# Patient Record
Sex: Female | Born: 1970 | Race: White | Hispanic: No | Smoking: Former smoker
Health system: Southern US, Community
[De-identification: ages and names within clinical notes are randomized; demographics above are authoritative.]

## PROBLEM LIST (undated history)

## (undated) DIAGNOSIS — Z8669 Personal history of other diseases of the nervous system and sense organs: Secondary | ICD-10-CM

## (undated) DIAGNOSIS — E282 Polycystic ovarian syndrome: Secondary | ICD-10-CM

## (undated) DIAGNOSIS — E079 Disorder of thyroid, unspecified: Secondary | ICD-10-CM

## (undated) HISTORY — PX: ABDOMINAL HYSTERECTOMY: SHX81

## (undated) HISTORY — DX: Personal history of other diseases of the nervous system and sense organs: Z86.69

## (undated) HISTORY — DX: Disorder of thyroid, unspecified: E07.9

## (undated) HISTORY — PX: TONSILLECTOMY: SUR1361

## (undated) HISTORY — DX: Polycystic ovarian syndrome: E28.2

---

## 2014-09-09 ENCOUNTER — Encounter: Payer: Self-pay | Admitting: Gastroenterology

## 2014-09-13 ENCOUNTER — Telehealth: Payer: Self-pay

## 2014-09-13 NOTE — Telephone Encounter (Signed)
Ref by Meriel Picarish Bellinger, NP for Thyroid Nodule, Hypothyroidism    Review Media Tab    [phone]

## 2014-09-14 NOTE — Telephone Encounter (Signed)
N/a in a reg clinic

## 2014-10-29 NOTE — Telephone Encounter (Signed)
Called referring doctor's office and notified them that the patient has been scheduled.

## 2014-10-29 NOTE — Telephone Encounter (Signed)
Left detailed message- pt scheduled for 11/19/14 @ 1:[facility]pm [facility]. Will mail letter to patients home.

## 2014-11-11 ENCOUNTER — Telehealth: Payer: Self-pay

## 2014-11-11 NOTE — Telephone Encounter (Signed)
See closed note 4/18. Patient calling stating she will not be able to take 6/24 appointment as she will be on vacation. Patient states she will be back after July 4. Patient can be reached at [phone] to schedule for after 7/4.

## 2014-11-12 NOTE — Telephone Encounter (Signed)
Appointment cancelled

## 2014-11-19 ENCOUNTER — Ambulatory Visit: Payer: Self-pay

## 2019-02-27 ENCOUNTER — Encounter: Payer: Self-pay | Admitting: Nurse Practitioner

## 2019-02-27 ENCOUNTER — Other Ambulatory Visit: Payer: Self-pay

## 2019-02-27 ENCOUNTER — Ambulatory Visit: Payer: Self-pay | Admitting: Nurse Practitioner

## 2019-02-27 VITALS — BP 106/75 | HR 71 | Temp 98.0°F | Resp 16 | Ht 63.0 in | Wt 165.0 lb

## 2019-02-27 DIAGNOSIS — L729 Follicular cyst of the skin and subcutaneous tissue, unspecified: Secondary | ICD-10-CM

## 2019-02-27 DIAGNOSIS — L089 Local infection of the skin and subcutaneous tissue, unspecified: Secondary | ICD-10-CM

## 2019-02-27 NOTE — Progress Notes (Signed)
   Subjective:    Patient ID: Deborah Collier, female    DOB: 1970-06-06, 48 y.o.   MRN: [id number]  HPI    Review of Systems     Objective:   Physical Exam        Assessment & Plan:

## 2019-02-27 NOTE — Progress Notes (Signed)
   Subjective:    Patient ID: Deborah Collier, female    DOB: 1971-04-04, 48 y.o.   MRN: [id number]  HPI Anaira is here today for complaints of right index finger lesion x 1 week. She reports this lesion came up and she opened the area up which nothing came from it. Since that time it's been painful, tender, swelling with redness. She denies any self treatment. COVID ROS negative  Review of Systems  Skin: Positive for color change and wound.       Right finger lesion       Objective:   Physical Exam Musculoskeletal:     Comments: FROM to all digits on right hand  Skin:    General: Skin is warm.     Comments: Right index finger at DIP right below nail bed is a hardened circular lesion that is tender, erythematous and slightly erythema around the lesion.  Neurological:     General: No focal deficit present.     Mental Status: He is oriented to person, place, and time.  Psychiatric:        Mood and Affect: Mood normal.           Assessment & Plan:

## 2019-02-27 NOTE — Patient Instructions (Signed)
Soak finger in warm epsom salt and apply samples of triple antibiotic ointment given in office until resolved. Monitor for worsening or signs of infections as discussed For adequate healing it's best not to manipulate or pick at the skin Allow at least 1 week for resolution of infection to decide if you want to see dermatology for the cyst, it may go away on its own Encouraged patient to call the office for an appointment if no improvement in symptoms or if symptoms change or worsen after 72 hours of planned treatment. Patient verbalized understanding of all instructions given/reviewed and has no further questions or concerns at this time.

## 2019-09-18 ENCOUNTER — Encounter: Payer: Self-pay | Admitting: Registered Nurse

## 2019-09-18 ENCOUNTER — Telehealth: Payer: Self-pay | Admitting: Registered Nurse

## 2019-09-18 ENCOUNTER — Telehealth: Payer: Self-pay | Admitting: Nurse Practitioner

## 2019-09-18 ENCOUNTER — Encounter: Payer: Self-pay | Admitting: Nurse Practitioner

## 2019-09-18 ENCOUNTER — Other Ambulatory Visit: Payer: Self-pay

## 2019-09-18 DIAGNOSIS — M542 Cervicalgia: Secondary | ICD-10-CM

## 2019-09-18 DIAGNOSIS — R519 Headache, unspecified: Secondary | ICD-10-CM

## 2019-09-18 MED ORDER — ACETAMINOPHEN 500 MG PO TABS: 1000.0000 mg | ORAL_TABLET | Freq: Four times a day (QID) | ORAL | 0 refills | Status: AC | PRN

## 2019-09-18 MED ORDER — BIOFREEZE 4 % EX GEL: 1.0000 "application " | Freq: Four times a day (QID) | CUTANEOUS | Status: AC | PRN

## 2019-09-18 NOTE — Patient Instructions (Addendum)
Muscle Cramps and Spasms Muscle cramps and spasms occur when a muscle or muscles tighten and you have no control over this tightening (involuntary muscle contraction). They are a common problem and can develop in any muscle. The most common place is in the calf muscles of the leg. Muscle cramps and muscle spasms are both involuntary muscle contractions, but there are some differences between the two:  Muscle cramps are painful. They come and go and may last for a few seconds or up to 15 minutes. Muscle cramps are often more forceful and last longer than muscle spasms.  Muscle spasms may or may not be painful. They may also last just a few seconds or much longer. Certain medical conditions, such as diabetes or Parkinson's disease, can make it more likely to develop cramps or spasms. However, cramps or spasms are usually not caused by a serious underlying problem. Common causes include:  Doing more physical work or exercise than your body is ready for (overexertion).  Overuse from repeating certain movements too many times.  Remaining in a certain position for a long period of time.  Improper preparation, form, or technique while playing a sport or doing an activity.  Dehydration.  Injury.  Side effects of some medicines.  Abnormally low levels of the salts and minerals in your blood (electrolytes), especially potassium and calcium. This could happen if you are taking water pills (diuretics) or if you are pregnant. In many cases, the cause of muscle cramps or spasms is not known. Follow these instructions at home: Managing pain and stiffness      Try massaging, stretching, and relaxing the affected muscle. Do this for several minutes at a time.  If directed, apply heat to tight or tense muscles as often as told by your health care provider. Use the heat source that your health care provider recommends, such as a moist heat pack or a heating pad. ? Place a towel between your skin and  the heat source. ? Leave the heat on for 20-30 minutes. ? Remove the heat if your skin turns bright red. This is especially important if you are unable to feel pain, heat, or cold. You may have a greater risk of getting burned.  If directed, put ice on the affected area. This may help if you are sore or have pain after a cramp or spasm. ? Put ice in a plastic bag. ? Place a towel between your skin and the bag. ? Leavethe ice on for 20 minutes, 2-3 times a day.  Try taking hot showers or baths to help relax tight muscles. Eating and drinking  Drink enough fluid to keep your urine pale yellow. Staying well hydrated may help prevent cramps or spasms.  Eat a healthy diet that includes plenty of nutrients to help your muscles function. A healthy diet includes fruits and vegetables, lean protein, whole grains, and low-fat or nonfat dairy products. General instructions  If you are having frequent cramps, avoid intense exercise for several days.  Take over-the-counter and prescription medicines only as told by your health care provider.  Pay attention to any changes in your symptoms.  Keep all follow-up visits as told by your health care provider. This is important. Contact a health care provider if:  Your cramps or spasms get more severe or happen more often.  Your cramps or spasms do not improve over time. Summary  Muscle cramps and spasms occur when a muscle or muscles tighten and you have no control over this   tightening (involuntary muscle contraction).  The most common place for cramps or spasms to occur is in the calf muscles of the leg.  Massaging, stretching, and relaxing the affected muscle may relieve the cramp or spasm.  Drink enough fluid to keep your urine pale yellow. Staying well hydrated may help prevent cramps or spasms. This information is not intended to replace advice given to you by your health care provider. Make sure you discuss any questions you have with your  health care provider. Document Revised: 10/07/2017 Document Reviewed: 10/07/2017 Elsevier Patient Education  2020 [employer] Musculoskeletal Pain Musculoskeletal pain refers to aches and pains in your bones, joints, muscles, and the tissues that surround them. This pain can occur in any part of the body. It can last for a short time (acute) or a long time (chronic). A physical exam, lab tests, and imaging studies may be done to find the cause of your musculoskeletal pain. Follow these instructions at home:  Lifestyle  Try to control or lower your stress levels. Stress increases muscle tension and can worsen musculoskeletal pain. It is important to recognize when you are anxious or stressed and learn ways to manage it. This may include: ? Meditation or yoga. ? Cognitive or behavioral therapy. ? Acupuncture or massage therapy.  You may continue all activities unless the activities cause more pain. When the pain gets better, slowly resume your normal activities. Gradually increase the intensity and duration of your activities or exercise. Managing pain, stiffness, and swelling  Take over-the-counter and prescription medicines only as told by your health care provider.  When your pain is severe, bed rest may be helpful. Lie or sit in any position that is comfortable, but get out of bed and walk around at least every couple of hours.  If directed, apply heat to the affected area as often as told by your health care provider. Use the heat source that your health care provider recommends, such as a moist heat pack or a heating pad. ? Place a towel between your skin and the heat source. ? Leave the heat on for 20-30 minutes. ? Remove the heat if your skin turns bright red. This is especially important if you are unable to feel pain, heat, or cold. You may have a greater risk of getting burned.  If directed, put ice on the painful area. ? Put ice in a plastic bag. ? Place a towel between your  skin and the bag. ? Leave the ice on for 20 minutes, 2-3 times a day. General instructions  Your health care provider may recommend that you see a physical therapist. This person can help you come up with a safe exercise program. Do any exercises as told by your physical therapist.  Keep all follow-up visits, including any physical therapy visits, as told by your health care providers. This is important. Contact a health care provider if:  Your pain gets worse.  Medicines do not help ease your pain.  You cannot use the part of your body that hurts, such as your arm, leg, or neck.  You have trouble sleeping.  You have trouble doing your normal activities. Get help right away if:  You have a new injury and your pain is worse or different.  You feel numb or you have tingling in the painful area. Summary  Musculoskeletal pain refers to aches and pains in your bones, joints, muscles, and the tissues that surround them.  This pain can occur in any part of  the body.  Your health care provider may recommend that you see a physical therapist. This person can help you come up with a safe exercise program. Do any exercises as told by your physical therapist.  Lower your stress level. Stress can worsen musculoskeletal pain. Ways to lower stress may include meditation, yoga, cognitive or behavioral therapy, acupuncture, and massage therapy. This information is not intended to replace advice given to you by your health care provider. Make sure you discuss any questions you have with your health care provider. Document Revised: 04/26/2017 Document Reviewed: 06/13/2016 Elsevier Patient Education  2020 [employer] Cervical Strain and Sprain Rehab Ask your health care provider which exercises are safe for you. Do exercises exactly as told by your health care provider and adjust them as directed. It is normal to feel mild stretching, pulling, tightness, or discomfort as you do these exercises.  Stop right away if you feel sudden pain or your pain gets worse. Do not begin these exercises until told by your health care provider. Stretching and range-of-motion exercises Cervical side bending  1. Using good posture, sit on a stable chair or stand up. 2. Without moving your shoulders, slowly tilt your left / right ear to your shoulder until you feel a stretch in the opposite side neck muscles. You should be looking straight ahead. 3. Hold for ___15-30_______ seconds. 4. Repeat with the other side of your neck. Repeat _____3_____ times. Complete this exercise ____3______ times a day. Cervical rotation  1. Using good posture, sit on a stable chair or stand up. 2. Slowly turn your head to the side as if you are looking over your left / right shoulder. ? Keep your eyes level with the ground. ? Stop when you feel a stretch along the side and the back of your neck. 3. Hold for ___15-30_______ seconds. 4. Repeat this by turning to your other side. Repeat ____3______ times. Complete this exercise _____3_____ times a day. Thoracic extension and pectoral stretch 1. Roll a towel or a small blanket so it is about 4 inches (10 cm) in diameter. 2. Lie down on your back on a firm surface. 3. Put the towel lengthwise, under your spine in the middle of your back. It should not be under your shoulder blades. The towel should line up with your spine from your middle back to your lower back. 4. Put your hands behind your head and let your elbows fall out to your sides. 5. Hold for ____15-30______ seconds. Repeat ____3______ times. Complete this exercise ______3____ times a day. Strengthening exercises Isometric upper cervical flexion 1. Lie on your back with a thin pillow behind your head and a small rolled-up towel under your neck. 2. Gently tuck your chin toward your chest and nod your head down to look toward your feet. Do not lift your head off the pillow. 3. Hold for ___15-30_______  seconds. 4. Release the tension slowly. Relax your neck muscles completely before you repeat this exercise. Repeat ____3______ times. Complete this exercise ____3______ times a day. Isometric cervical extension  1. Stand about 6 inches (15 cm) away from a wall, with your back facing the wall. 2. Place a soft object, about 6-8 inches (15-20 cm) in diameter, between the back of your head and the wall. A soft object could be a small pillow, a ball, or a folded towel. 3. Gently tilt your head back and press into the soft object. Keep your jaw and forehead relaxed. 4. Hold for ___15-30_______ seconds. 5.  Release the tension slowly. Relax your neck muscles completely before you repeat this exercise. Repeat ____3______ times. Complete this exercise ___3_______ times a day. Posture and body mechanics Body mechanics refers to the movements and positions of your body while you do your daily activities. Posture is part of body mechanics. Good posture and healthy body mechanics can help to relieve stress in your body's tissues and joints. Good posture means that your spine is in its natural S-curve position (your spine is neutral), your shoulders are pulled back slightly, and your head is not tipped forward. The following are general guidelines for applying improved posture and body mechanics to your everyday activities. Sitting  1. When sitting, keep your spine neutral and keep your feet flat on the floor. Use a footrest, if necessary, and keep your thighs parallel to the floor. Avoid rounding your shoulders, and avoid tilting your head forward. 2. When working at a desk or a computer, keep your desk at a height where your hands are slightly lower than your elbows. Slide your chair under your desk so you are close enough to maintain good posture. 3. When working at a computer, place your monitor at a height where you are looking straight ahead and you do not have to tilt your head forward or downward to look  at the screen. Standing   When standing, keep your spine neutral and keep your feet about hip-width apart. Keep a slight bend in your knees. Your ears, shoulders, and hips should line up.  When you do a task in which you stand in one place for a long time, place one foot up on a stable object that is 2-4 inches (5-10 cm) high, such as a footstool. This helps keep your spine neutral. Resting When lying down and resting, avoid positions that are most painful for you. Try to support your neck in a neutral position. You can use a contour pillow or a small rolled-up towel. Your pillow should support your neck but not push on it. This information is not intended to replace advice given to you by your health care provider. Make sure you discuss any questions you have with your health care provider. Document Revised: 09/03/2018 Document Reviewed: 02/12/2018 Elsevier Patient Education  2020 [employer].  Migraine Headache A migraine headache is an intense, throbbing pain on one side or both sides of the head. Migraine headaches may also cause other symptoms, such as nausea, vomiting, and sensitivity to light and noise. A migraine headache can last from 4 hours to 3 days. Talk with your doctor about what things may bring on (trigger) your migraine headaches. What are the causes? The exact cause of this condition is not known. However, a migraine may be caused when nerves in the brain become irritated and release chemicals that cause inflammation of blood vessels. This inflammation causes pain. This condition may be triggered or caused by:  Drinking alcohol.  Smoking.  Taking medicines, such as: ? Medicine used to treat chest pain (nitroglycerin). ? Birth control pills. ? Estrogen. ? Certain blood pressure medicines.  Eating or drinking products that contain nitrates, glutamate, aspartame, or tyramine. Aged cheeses, chocolate, or caffeine may also be triggers.  Doing physical activity. Other  things that may trigger a migraine headache include:  Menstruation.  Pregnancy.  Hunger.  Stress.  Lack of sleep or too much sleep.  Weather changes.  Fatigue. What increases the risk? The following factors may make you more likely to experience migraine headaches:  Being  a certain age. This condition is more common in people who are 2225-50 years old.  Being female.  Having a family history of migraine headaches.  Being Caucasian.  Having a mental health condition, such as depression or anxiety.  Being obese. What are the signs or symptoms? The main symptom of this condition is pulsating or throbbing pain. This pain may:  Happen in any area of the head, such as on one side or both sides.  Interfere with daily activities.  Get worse with physical activity.  Get worse with exposure to bright lights or loud noises. Other symptoms may include:  Nausea.  Vomiting.  Dizziness.  General sensitivity to bright lights, loud noises, or smells. Before you get a migraine headache, you may get warning signs (an aura). An aura may include:  Seeing flashing lights or having blind spots.  Seeing bright spots, halos, or zigzag lines.  Having tunnel vision or blurred vision.  Having numbness or a tingling feeling.  Having trouble talking.  Having muscle weakness. Some people have symptoms after a migraine headache (postdromal phase), such as:  Feeling tired.  Difficulty concentrating. How is this diagnosed? A migraine headache can be diagnosed based on:  Your symptoms.  A physical exam.  Tests, such as: ? CT scan or an MRI of the head. These imaging tests can help rule out other causes of headaches. ? Taking fluid from the spine (lumbar puncture) and analyzing it (cerebrospinal fluid analysis, or CSF analysis). How is this treated? This condition may be treated with medicines that:  Relieve pain.  Relieve nausea.  Prevent migraine headaches. Treatment  for this condition may also include:  Acupuncture.  Lifestyle changes like avoiding foods that trigger migraine headaches.  Biofeedback.  Cognitive behavioral therapy. Follow these instructions at home: Medicines  Take over-the-counter and prescription medicines only as told by your health care provider.  Ask your health care provider if the medicine prescribed to you: ? Requires you to avoid driving or using heavy machinery. ? Can cause constipation. You may need to take these actions to prevent or treat constipation:  Drink enough fluid to keep your urine pale yellow.  Take over-the-counter or prescription medicines.  Eat foods that are high in fiber, such as beans, whole grains, and fresh fruits and vegetables.  Limit foods that are high in fat and processed sugars, such as fried or sweet foods. Lifestyle  Do not drink alcohol.  Do not use any products that contain nicotine or tobacco, such as cigarettes, e-cigarettes, and chewing tobacco. If you need help quitting, ask your health care provider.  Get at least 8 hours of sleep every night.  Find ways to manage stress, such as meditation, deep breathing, or yoga. General instructions      Keep a journal to find out what may trigger your migraine headaches. For example, write down: ? What you eat and drink. ? How much sleep you get. ? Any change to your diet or medicines.  If you have a migraine headache: ? Avoid things that make your symptoms worse, such as bright lights. ? It may help to lie down in a dark, quiet room. ? Do not drive or use heavy machinery. ? Ask your health care provider what activities are safe for you while you are experiencing symptoms.  Keep all follow-up visits as told by your health care provider. This is important. Contact a health care provider if:  You develop symptoms that are different or more severe than your  usual migraine headache symptoms.  You have more than 15 headache days  in one month. Get help right away if:  Your migraine headache becomes severe.  Your migraine headache lasts longer than 72 hours.  You have a fever.  You have a stiff neck.  You have vision loss.  Your muscles feel weak or like you cannot control them.  You start to lose your balance often.  You have trouble walking.  You faint.  You have a seizure. Summary  A migraine headache is an intense, throbbing pain on one side or both sides of the head. Migraines may also cause other symptoms, such as nausea, vomiting, and sensitivity to light and noise.  This condition may be treated with medicines and lifestyle changes. You may also need to avoid certain things that trigger a migraine headache.  Keep a journal to find out what may trigger your migraine headaches.  Contact your health care provider if you have more than 15 headache days in a month or you develop symptoms that are different or more severe than your usual migraine headache symptoms. This information is not intended to replace advice given to you by your health care provider. Make sure you discuss any questions you have with your health care provider. Document Revised: 09/05/2018 Document Reviewed: 06/26/2018 Elsevier Patient Education  [place].

## 2019-09-18 NOTE — Progress Notes (Signed)
Subjective:    Patient ID: Deborah Collier, female    DOB: Apr 06, 1971, 49 y.o.   MRN: [id number]  49y/o established caucasian female patient here for evaluation headache/neck pain.  Started this weekend Saturday after hard cry speaking with friend. Felt pain creep up neck into back of head/scalp. Was feeling better Monday 4 days ago.  Today she has a spot left side near base of skull that is trigger point for pain when she touches.  Denied feeling lump/s.  Denied trauma/rash/bruising/swelling.  Took OTC advil and excedrin and helped but reoccurred today after having stressful verbal communication with employee this morning.  Feeling funny; not dizzy.  She is separated from her family in [place] while working at [employer].  Has seen [facility] for PCOS and hypothyroidism f/u TSH normal this year and reported labs with PCM earlier this year were good also but I am not able to see PCM results in epic.  Denied visual changes, changes in speech, dysphagia, n/v/d, chest pain, SOB, dyspnea, dizziness or lightheadedness.  She has other Rx migraine medicine but didn't take it as it didn't feel like her typical migraine.  Has been working out two days a week with a trainer and had success at weight loss for weight gain over the previous year during covid 19 pandemic.  Patient requested video visit today.     Review of Systems  Constitutional: Negative for activity change, appetite change, chills, diaphoresis, fatigue, fever and unexpected weight change.  HENT: Negative for hearing loss, trouble swallowing and voice change.   Eyes: Negative for photophobia, pain, discharge, redness, itching and visual disturbance.  Respiratory: Negative for chest tightness and shortness of breath.   Cardiovascular: Negative for chest pain.  Gastrointestinal: Negative for abdominal pain, diarrhea, nausea and vomiting.  Endocrine: Negative for cold intolerance and heat intolerance.  Genitourinary: Negative for difficulty  urinating.  Musculoskeletal: Positive for neck pain. Negative for arthralgias, back pain, gait problem, joint swelling, myalgias and neck stiffness.  Skin: Negative for color change and rash.  Allergic/Immunologic: Positive for environmental allergies. Negative for food allergies and immunocompromised state.  Neurological: Positive for headaches. Negative for dizziness, tremors, seizures, syncope, facial asymmetry, speech difficulty, weakness, light-headedness and numbness.  Hematological: Negative for adenopathy. Does not bruise/bleed easily.  Psychiatric/Behavioral: Negative for agitation, confusion, decreased concentration and sleep disturbance.       Objective:   Physical Exam Constitutional:      General: He is awake. He is not in acute distress.    Appearance: Normal appearance. He is well-developed, well-groomed and overweight. He is not ill-appearing, toxic-appearing or diaphoretic.  HENT:     Head: Normocephalic and atraumatic.     Jaw: There is normal jaw occlusion. No trismus, tenderness, swelling, pain on movement or malocclusion.     Salivary Glands: Right salivary gland is not diffusely enlarged or tender. Left salivary gland is not diffusely enlarged or tender.     Right Ear: Hearing and external ear normal.     Left Ear: Hearing and external ear normal.     Nose: Nose normal.     Mouth/Throat:     Lips: Pink. No lesions.     Mouth: Mucous membranes are moist. No oral lesions or angioedema.     Dentition: No gum lesions.     Tongue: No lesions. Tongue does not deviate from midline.     Palate: No mass and lesions.     Pharynx: Oropharynx is clear.  Eyes:  General: Vision grossly intact. Gaze aligned appropriately. No allergic shiner, visual field deficit or scleral icterus.       Right eye: No discharge.        Left eye: No discharge.     Extraocular Movements: Extraocular movements intact.     Right eye: Normal extraocular motion and no nystagmus.     Left eye:  Normal extraocular motion and no nystagmus.     Conjunctiva/sclera: Conjunctivae normal.     Pupils: Pupils are equal, round, and reactive to light. Pupils are equal.     Right eye: Pupil is round.     Left eye: Pupil is round.  Neck:     Thyroid: No thyromegaly.     Trachea: Trachea and phonation normal. No tracheal deviation.      Comments: Longus capitus/sternocleidomastoid and cervical paraspinal 1-4 muscles TTP left; pain with left rotation 45 degrees and cervical flexion/chin to chest "tight/uncomfortable"; bilateral trapezius tight; denied lumps or bumps felt and none seen on video with AROM full extension/flexion/lateral bending and rotation cervical with some discomfort noted rotation/flexion Pulmonary:     Effort: Pulmonary effort is normal. No respiratory distress.     Breath sounds: Normal breath sounds and air entry. No stridor, decreased air movement or transmitted upper airway sounds. No wheezing, rhonchi or rales.     Comments: Spoke full sentences without difficulty; no cough observed during video visit Abdominal:     General: Abdomen is flat.     Palpations: Abdomen is soft.  Musculoskeletal:        General: Tenderness present. No swelling. Normal range of motion.     Right shoulder: Normal.     Left shoulder: Normal.     Right elbow: Normal.     Left elbow: Normal.     Right hand: Normal.     Left hand: Normal.     Cervical back: Normal range of motion and neck supple. Tenderness present. No swelling, edema, deformity, erythema, signs of trauma, lacerations, rigidity, torticollis or crepitus. Pain with movement and muscular tenderness present. Normal range of motion.     Thoracic back: Normal.     Lumbar back: Normal.  Lymphadenopathy:     Head:     Right side of head: No submental, submandibular, tonsillar, preauricular, posterior auricular or occipital adenopathy.     Left side of head: No submental, submandibular, tonsillar, preauricular, posterior auricular or  occipital adenopathy.     Cervical: No cervical adenopathy.     Right cervical: No superficial or posterior cervical adenopathy.    Left cervical: No superficial or posterior cervical adenopathy.  Skin:    General: Skin is warm and dry.     Capillary Refill: Capillary refill takes less than 2 seconds.     Coloration: Skin is not ashen, cyanotic, jaundiced, mottled, pale or sallow.     Findings: No abrasion, abscess, acne, bruising, burn, ecchymosis, erythema, signs of injury, laceration, lesion, petechiae, rash or wound.     Nails: There is no clubbing.  Neurological:     General: No focal deficit present.     Mental Status: He is alert and oriented to person, place, and time. Mental status is at baseline.     GCS: GCS eye subscore is 4. GCS verbal subscore is 5.     Cranial Nerves: Cranial nerves are intact. No cranial nerve deficit, dysarthria or facial asymmetry.     Motor: Motor function is intact. No weakness, tremor, atrophy, abnormal muscle tone or seizure  activity.     Coordination: Coordination is intact. Coordination normal.     Gait: Gait is intact. Gait normal.     Comments: Gait observed sure and steady in her room; in/out of chair without difficulty  Psychiatric:        Attention and Perception: Attention and perception normal.        Mood and Affect: Mood and affect normal. Mood is not anxious or depressed.        Speech: Speech normal.        Behavior: Behavior normal. Behavior is not agitated. Behavior is cooperative.        Cognition and Memory: Cognition and memory normal. Cognition is not impaired. Memory is not impaired.        Judgment: Judgment normal.           Assessment & Plan:  A-recurrent occipital headache, neck pain  A-BP 112/82 last visit Feb 2021 endocrinology does not have BP cuff at home consider BP check at [facility] with RN.  Discussed signs and symptoms of stroke and aneurysm with patient as she had questions what symptoms would be discussed sudden  acute worst headache of life (doesn't improve with usual headache remedies), visual changes, dysphagia/dysphasia, extremity/facial weakness (lopsided smile).   For acute pain, rest, and intermittent application of heat, analgesics, and PRN po NSAIDS. Avoid known triggers e.g. sleep deprivation, foods, stress, dehydration. If headache is the worst headache of entire life and came on like a clap of thunder patient was instructed to go to the Emergency Room/call 911. Call or return to clinic as needed if these symptoms worsen or fail to improve as anticipated. Exitcare handout on headaches put in patient my chart and if unable to access mychart can call [facility] front desk to have them emailed to her.  Discussed elevated blood pressure/muscle spasms can precipitate headache also.  Currently having colder than normal weather pattern/windy atmospheric changes also along with spring bloom/tree pollen counts high.  Patient with seasonal allergic rhinitis. Patient verbalized agreement and understanding of treatment plan and had no further questions at this time. P2: Diet and fitness   Discussed stretching at least TID and 15-30 seconds 3 reps each exercise.  Discussed purpose and use of muscle relaxers/heat/NSAIDS/biofreeze.  Biofreeze gel topical QID prn pain OTC  Patient refused cyclobenazeprine/flexeril 10mg  po TID prn muscle spasms Rx but discussed with her she could call back if she changes her mind and #30 RF0 would be sent electronically to her pharmacy of choice. Max dose Ibuprofen 800mg  po TID prn pain OTC take with food to avoid stomach upset/gastritis.   Avoid alcohol intake and driving for 8 hours after taking cyclobenazeprine/flexeril as drowsiness common side effect.  Slow position changes as medication also lowers blood pressure.  Home stretches demonstrated to patient-e.g. Arm circles, arm pendulums, chest stretches, neck AROM, chin tucks, and trapezius pinch. Self massage or professional prn, foam roller use  or tennis/racquetball.  Heat/cryotherapy 15 minutes QID prn.  Trial thermacare patch to be considered.  Consider physical therapy referral if no improvement with prescribed therapy from [facility] and/or chiropractic care.  Ensure ergonomics correct desk at work avoid repetitive motions if possible/holding phone/laptop in hand use desk/stand and/or break up lifting items into smaller loads/weights.  Patient was instructed to rest, ice, and ROM exercises.  Activity as tolerated.   Follow up if symptoms persist or worsen especially if loss of bowel/bladder control, arm/leg weakness and/or saddle paresthesias.  Exitcare handout on muscle spasms and cervical  strain rehab exercises, musculoskeletal pain placed in patient my chart.  Patient verbalized agreement and understanding of treatment plan and had no further questions at this time.  P2:  Injury Prevention and Fitness.

## 2019-09-22 ENCOUNTER — Telehealth: Payer: Self-pay | Admitting: Registered Nurse

## 2020-03-02 ENCOUNTER — Other Ambulatory Visit: Payer: Self-pay

## 2020-03-02 ENCOUNTER — Ambulatory Visit: Payer: BC Managed Care – PPO | Admitting: Podiatry

## 2020-03-02 ENCOUNTER — Ambulatory Visit (INDEPENDENT_AMBULATORY_CARE_PROVIDER_SITE_OTHER): Payer: BC Managed Care – PPO

## 2020-03-02 ENCOUNTER — Encounter: Payer: Self-pay | Admitting: Podiatry

## 2020-03-02 ENCOUNTER — Other Ambulatory Visit: Payer: Self-pay | Admitting: Podiatry

## 2020-03-02 DIAGNOSIS — S99922A Unspecified injury of left foot, initial encounter: Secondary | ICD-10-CM | POA: Diagnosis not present

## 2020-03-02 DIAGNOSIS — M6740 Ganglion, unspecified site: Secondary | ICD-10-CM | POA: Diagnosis not present

## 2020-03-02 DIAGNOSIS — M25872 Other specified joint disorders, left ankle and foot: Secondary | ICD-10-CM | POA: Diagnosis not present

## 2020-03-02 NOTE — Progress Notes (Signed)
  Subjective:  Patient ID: Deborah Collier, female    DOB: January 19, 1971,  MRN: [id number]  Chief Complaint  Patient presents with  . Toe Pain    Patient presents today with "bump" on her 2nd left toe x 4-5 months and getting bigger.  She says "the knot doesn't hurt, but I have pain under my toe and it travels down the middle of my foot"    49 y.o. female presents with the above complaint. History confirmed with patient.   Objective:  Physical Exam: warm, good capillary refill, no trophic changes or ulcerative lesions, normal DP and PT pulses and normal sensory exam. Left Foot: Small soft fluctuant cystic mass of the DIPJ of the second digit.  Pain on palpation to the plantar plate and with dorsal translation.  No gross instability.  No evidence of neuroma with palpation of either interspace or lateral compression  Radiographs: X-ray of the left foot: Short first ray with the second metatarsal being the longest followed by the third followed by the first.  On the oblique view there is a small avulsion of the base of the proximal phalanx of the second MTPJ plantarly Assessment:   1. Mucoid cyst of joint   2. 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 Injury of plantar plate of left foot, initial encounter      Plan:  Patient was evaluated and treated and all questions answered.  Discussed with her the etiology and treatment options for mucoid and ganglion cysts.  Hers appears to be rising off the DIPJ of the second digit or potentially the long extensor tendon.  I recommended that she continue to monitor this and if it becomes painful we can consider surgical excision or drainage.  Does not currently bother her very much.  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  This again is only intermittently bothersome and she has no gross instability of the joint currently or  hammertoe formation.  I discussed with her biomechanics of this and how these form occultly with a long second ray.  I recommend that we stabilizing braces using a Budin splint or hammertoe strapping which I showed her.  If it is more bothersome we can consider surgical correction following further evaluation with MRI or ultrasound.  She does not have a bunion and hopefully this will not develop into an overlapping toe.  Sharl Ma, DPM 03/02/2020     No follow-ups on file.

## 2020-07-19 ENCOUNTER — Other Ambulatory Visit: Payer: Self-pay | Admitting: Unknown Physician Specialty

## 2020-07-19 DIAGNOSIS — R42 Dizziness and giddiness: Secondary | ICD-10-CM

## 2020-08-03 ENCOUNTER — Ambulatory Visit: Payer: BC Managed Care – PPO

## 2020-12-16 ENCOUNTER — Other Ambulatory Visit: Payer: Self-pay

## 2020-12-16 ENCOUNTER — Ambulatory Visit: Payer: BC Managed Care – PPO | Admitting: Nurse Practitioner

## 2020-12-16 VITALS — BP 116/80 | HR 89 | Temp 98.6°F | Resp 16

## 2020-12-16 DIAGNOSIS — L739 Follicular disorder, unspecified: Secondary | ICD-10-CM

## 2020-12-16 MED ORDER — SULFAMETHOXAZOLE-TRIMETHOPRIM 800-160 MG PO TABS: 1.0000 | ORAL_TABLET | Freq: Two times a day (BID) | ORAL | 0 refills | Status: AC

## 2020-12-16 NOTE — Progress Notes (Signed)
   Subjective:    Patient ID: Deborah Collier, female    DOB: August 08, 1970, 50 y.o.   MRN: [id number]  HPI  50 year old female presenting to [facility] today with complaints left sided facial swelling that started after pulling a hair from her face two days ago. After pulling the hair out there was bleeding at the area and has been swelling since. Today is warm and tender to touch.   Last night she attempted to wash area with alcohol and placed topical antibiotic ointment to area which seemed to irritate her skin more.   Today's Vitals   12/16/20 1016  BP: 116/80  Pulse: 89  Resp: 16  Temp: 98.6 F (37 C)  TempSrc: Tympanic  SpO2: 98%   There is no height or weight on file to calculate BMI.    Review of Systems  Constitutional: Negative.   HENT: Negative.    Respiratory: Negative.    Cardiovascular: Negative.   Skin:  Positive for wound.  Neurological: Negative.     Current Outpatient Medications  Medication Instructions   aspirin-acetaminophen-caffeine (EXCEDRIN MIGRAINE) 250-250-65 MG tablet 1 tablet, Oral, Every 6 hours PRN   ibuprofen (ADVIL) 200 mg, Oral, Every 6 hours PRN   levothyroxine (SYNTHROID) 75 mcg, Oral, Daily before breakfast   metFORMIN (GLUCOPHAGE) 1,500 mg, Oral, Every evening   metFORMIN (GLUCOPHAGE) 1,500 mg, Daily   Phendimetrazine Tartrate 105 MG CP24 Oral   rizatriptan (MAXALT-MLT) 5 mg, Oral, Daily   sulfamethoxazole-trimethoprim (BACTRIM DS) 800-160 MG tablet 1 tablet, Oral, 2 times daily       Objective:   Physical Exam HENT:     Head: Normocephalic.  Musculoskeletal:     Cervical back: Normal range of motion.  Skin:    General: Skin is warm.     Findings: Abscess present.          Comments: Skin warm with hardened area under skin surface abrasion to left cheek as highlighted in image above. No active drainage.   Neurological:     General: No focal deficit present.     Mental Status: She is alert.  Psychiatric:         Mood and Affect: Mood normal.          Assessment & Plan:  1. Folliculitis  - sulfamethoxazole-trimethoprim (BACTRIM DS) 800-160 MG tablet; Take 1 tablet by mouth 2 (two) times daily for 7 days.  Dispense: 14 tablet; Refill: 0    Advised Advil for pain and inflammation  Warm compress to area   Return to clinic if symptoms persist or with new concerns.

## 2021-04-25 ENCOUNTER — Encounter: Payer: Self-pay | Admitting: Medical

## 2021-04-25 ENCOUNTER — Ambulatory Visit: Payer: BC Managed Care – PPO | Admitting: Medical

## 2021-04-25 ENCOUNTER — Other Ambulatory Visit: Payer: Self-pay

## 2021-04-25 VITALS — BP 122/78 | HR 100 | Temp 97.5°F | Resp 16

## 2021-04-25 DIAGNOSIS — Z20822 Contact with and (suspected) exposure to covid-19: Secondary | ICD-10-CM

## 2021-04-25 DIAGNOSIS — U071 COVID-19: Secondary | ICD-10-CM

## 2021-04-25 LAB — POC COVID19 BINAXNOW: SARS Coronavirus 2 Ag: POSITIVE — AB

## 2021-04-25 NOTE — Progress Notes (Signed)
Subjective:    Patient ID: Deborah Collier, female    DOB: 1970/11/24, 50 y.o.   MRN: [id number]  HPI 50 yo female in non acute distress, states she had Dry throat on Fridayand pressure in nose and bodyaches. Exposed to Covid -19 work [employer], last Tuesday. Non productive cough, no SOB or CP. Worse on Froday. So improved today. Yesterday lost taste and smell  smoker On Metformin for PCOS  Senstive to Morphine due to vomiting. Takes Zyrtec nightly.  Vaccinated Moderna one booster  Blood pressure 122/78, pulse 100, temperature (!) 97.5 F (36.4 C), temperature source Tympanic, resp. rate 16, SpO2 98 %.  No Known Allergies   Review of Systems  Constitutional:  Negative for chills, fatigue and fever.  HENT:  Positive for congestion, sinus pressure and sore throat (friday).        Loss taste and smell yesterday  6pm  Respiratory:  Positive for cough (friday). Negative for shortness of breath and wheezing.   Cardiovascular:  Negative for chest pain.  Gastrointestinal:  Negative for abdominal pain, diarrhea, nausea and vomiting.  Genitourinary:  Negative for difficulty urinating.  Musculoskeletal:  Positive for myalgias.  Skin:  Negative for color change.  Neurological:  Negative for dizziness.      Objective:   Physical Exam Vitals and nursing note reviewed.  Constitutional:      Appearance: Normal appearance.  HENT:     Head: Normocephalic and atraumatic.     Right Ear: Tympanic membrane, ear canal and external ear normal.     Left Ear: Tympanic membrane, ear canal and external ear normal.     Mouth/Throat:     Mouth: Mucous membranes are moist.     Pharynx: Oropharynx is clear.  Eyes:     Extraocular Movements: Extraocular movements intact.     Conjunctiva/sclera: Conjunctivae normal.     Pupils: Pupils are equal, round, and reactive to light.  Cardiovascular:     Rate and Rhythm: Normal rate and regular rhythm.  Pulmonary:     Effort: Pulmonary effort is  normal.     Breath sounds: Normal breath sounds.  Musculoskeletal:        General: Normal range of motion.     Cervical back: Normal range of motion and neck supple.  Skin:    General: Skin is warm and dry.  Neurological:     General: No focal deficit present.     Mental Status: She is alert and oriented to person, place, and time.  Psychiatric:        Mood and Affect: Mood normal.        Behavior: Behavior normal.        Thought Content: Thought content normal.        Judgment: Judgment normal.     Recent Results (from the past 2160 hour(s))  POC COVID-19     Status: Abnormal   Collection Time: 04/25/21  9:15 AM  Result Value Ref Range   SARS Coronavirus 2 Ag Positive (A) Negative       Covid-19  test positive Assessment & Plan:  Covid-19 infection  Isolate, Rest, increase fluids, OTC Motrin or Tylenol for fever or pain take per package instructions. OTC Flonase for nasal pressure per package instructions. Given treatment options, she is already improving. May return to work on April 27, 2021 if no fever x 24 hours with no OTC Motrin or Tylenol being taken and all other symptoms are improving. Must wear a mask x 5 days  once returned to work. Patient verbalizes understanding and has no questions at discharge.

## 2021-04-25 NOTE — Patient Instructions (Signed)

## 2021-06-20 ENCOUNTER — Ambulatory Visit
Admission: RE | Admit: 2021-06-20 | Discharge: 2021-06-20 | Disposition: A | Discharge status: Home / Self Care | Payer: BC Managed Care – PPO | Source: Ambulatory Visit | Attending: Medical | Admitting: Medical

## 2021-06-20 ENCOUNTER — Ambulatory Visit: Payer: BC Managed Care – PPO | Admitting: Medical

## 2021-06-20 ENCOUNTER — Other Ambulatory Visit: Payer: Self-pay

## 2021-06-20 ENCOUNTER — Encounter: Payer: Self-pay | Admitting: Medical

## 2021-06-20 VITALS — BP 132/86 | HR 88 | Temp 97.9°F | Resp 16

## 2021-06-20 DIAGNOSIS — M79642 Pain in left hand: Secondary | ICD-10-CM

## 2021-06-20 NOTE — Patient Instructions (Addendum)
Wear splint to  protect hand and fingers as discussed.  RICE Therapy for Routine Care of Injuries Many injuries can be cared for with rest, ice, compression, and elevation (RICE therapy). This includes: Resting the injured body part. Putting ice on the injury. Putting pressure (compression) on the injury. Raising the injured part (elevation). Using RICE therapy can help to lessen pain and swelling. Supplies needed: Ice. Plastic bag. Towel. Elastic bandage. Pillow or pillows to raise your injured body part. How to care for your injury with RICE therapy Rest Try to rest the injured part of your body. You can go back to your normal activities when your doctor says it is okay to do them and when you can do them without pain. If you rest the injury too much, it may not heal as well. Some injuries heal better with early movement instead of resting for too long. Ask your doctor if you should do exercises to help your injury get better. Ice  If told, put ice on the injured area. To do this: Put ice in a plastic bag. Place a towel between your skin and the bag. Leave the ice on for 20 minutes, 2-3 times a day. Take off the ice if your skin turns bright red. This is very important. If you cannot feel pain, heat, or cold, you have a greater risk of damage to the area. Do not put ice on your bare skin. Use ice for as many days as your doctor tells you to use it. Compression Put pressure on the injured area. This can be done with an elastic bandage. If this type of bandage has been put on your injury: Follow instructions on the package the bandage came in about how to use it. Do not wrap the bandage too tightly. Wrap the bandage more loosely if part of your body beyond the bandage is blue, swollen, cold, painful, or loses feeling. Take off the bandage and put it on again every 3-4 hours or as told by your doctor. See your doctor if the bandage seems to make your problems  worse.  Elevation Raise the injured area above the level of your heart while you are sitting or lying down. Follow these instructions at home: If your symptoms get worse or last a long time, make a follow-up appointment with your doctor. You may need to have imaging tests, such as X-rays or an MRI. If you have imaging tests, ask how to get your results when they are ready. Return to your normal activities when your doctor says that it is safe. Keep all follow-up visits. Contact a doctor if: You keep having pain and swelling. Your symptoms get worse. Get help right away if: You have sudden, very bad pain at your injury or lower than your injury. You have redness or more swelling around your injury. You have tingling or numbness at your injury or lower than your injury, and it does not go away when you take off the bandage. Summary Many injuries can be cared for using rest, ice, compression, and elevation (RICE therapy). You can go back to your normal activities when your doctor says it is okay and when you can do them without pain. Put ice on the injured area as told by your doctor. Get help if your symptoms get worse or if you keep having pain and swelling. This information is not intended to replace advice given to you by your health care provider. Make sure you discuss any questions you have  with your health care provider. Document Revised: 03/03/2020 Document Reviewed: 03/03/2020 Elsevier Patient Education  2022 [employer].

## 2021-06-20 NOTE — Progress Notes (Signed)
° °  Subjective:    Patient ID: Deborah Collier, female    DOB: 10-03-1970, 51 y.o.   MRN: [id number]  HPI 51 yo female in non acute distress presents to day with left hand pain after tripping in her garage and hitting her hand on the door and door frame. She has swelling on the dorsum of hand and into her 4th and 5th fingers. She says it is painful but taking Ibuprofen and an Excedrin ( for a migraine today).  No prior injury. She is right handed.   Review of Systems  Musculoskeletal:        Left hand pain  Deies numbness or tingling.    Objective:   Physical Exam Vitals and nursing note reviewed.  Constitutional:      Appearance: Normal appearance.  HENT:     Head: Normocephalic and atraumatic.  Eyes:     Extraocular Movements: Extraocular movements intact.     Conjunctiva/sclera: Conjunctivae normal.     Pupils: Pupils are equal, round, and reactive to light.  Pulmonary:     Effort: Pulmonary effort is normal.  Musculoskeletal:        General: Swelling, tenderness and signs of injury present. No deformity.       Hands:     Comments: Area marked is swollen and bruising is noted in the 4th and 5th digits and the dorsum of the left hand. Patient cannot oppose thumb and touch  5 or 4th digit.  Skin:    General: Skin is warm and dry.     Findings: Bruising present. No erythema.  Neurological:     Mental Status: She is alert.         Assessment & Plan:  Left hand pain  Elevate, Ice, OTC Ibuprofen 800 mg three times a day for pain with food. Patient placed into a splint  to protect hand.  Wear splint to protect hand. Sen for x-ray of left hand. Will call patient once I have the results. Patient verbalizes understanding and has no questions at discharge.

## 2021-06-21 ENCOUNTER — Ambulatory Visit: Payer: BC Managed Care – PPO | Admitting: Medical

## 2021-06-21 DIAGNOSIS — S6000XA Contusion of unspecified finger without damage to nail, initial encounter: Secondary | ICD-10-CM

## 2021-06-21 NOTE — Progress Notes (Signed)
°  Called patient and reviewed x-ray left hand with patient. Narrative & Impression  CLINICAL DATA:  [address] and hit hand on door and doorframe 06/19/2021. Pain and swelling of left middle and ring fingers from metacarpophalangeal joints to distal phalanx.   EXAM: LEFT HAND - COMPLETE 3+ VIEW   COMPARISON:  None   FINDINGS: There is no evidence of fracture or dislocation. There is no evidence of arthropathy or other focal bone abnormality. Soft tissues are unremarkable.   IMPRESSION: Degenerative changes including joint space narrowing subchondral sclerosis, and peripheral osteophytosis are mild-to-moderate at the thumb carpometacarpal joint, mild at the triscaphe joint, and mild at the second through fourth DIP joints.     Electronically Signed   By: Yvonne Kendall M.D.   On: 06/20/2021 20:53    She is to follow up with her doctor as scheduled . She states hand feels better with splint and Ibuprofen. She is icing and elevating.   She verbalizes understanding and has no questions at the end of our conversation.

## 2021-08-10 ENCOUNTER — Other Ambulatory Visit: Payer: Self-pay | Admitting: Family Medicine

## 2021-09-18 ENCOUNTER — Ambulatory Visit
Admission: RE | Admit: 2021-09-18 | Discharge: 2021-09-18 | Disposition: A | Discharge status: Home / Self Care | Payer: BC Managed Care – PPO | Source: Ambulatory Visit | Attending: Family Medicine | Admitting: Family Medicine

## 2021-09-18 DIAGNOSIS — Z1231 Encounter for screening mammogram for malignant neoplasm of breast: Secondary | ICD-10-CM | POA: Insufficient documentation

## 2021-09-20 ENCOUNTER — Other Ambulatory Visit: Payer: Self-pay | Admitting: Family Medicine

## 2021-09-20 DIAGNOSIS — R928 Other abnormal and inconclusive findings on diagnostic imaging of breast: Secondary | ICD-10-CM

## 2021-09-20 DIAGNOSIS — N6489 Other specified disorders of breast: Secondary | ICD-10-CM

## 2021-09-25 ENCOUNTER — Ambulatory Visit
Admission: RE | Admit: 2021-09-25 | Discharge: 2021-09-25 | Disposition: A | Discharge status: Home / Self Care | Payer: BC Managed Care – PPO | Source: Ambulatory Visit | Attending: Family Medicine | Admitting: Family Medicine

## 2021-09-25 DIAGNOSIS — R928 Other abnormal and inconclusive findings on diagnostic imaging of breast: Secondary | ICD-10-CM | POA: Diagnosis not present

## 2021-09-25 DIAGNOSIS — N6489 Other specified disorders of breast: Secondary | ICD-10-CM | POA: Diagnosis present

## 2021-09-29 ENCOUNTER — Other Ambulatory Visit: Payer: Self-pay | Admitting: Family Medicine

## 2021-09-29 DIAGNOSIS — N6489 Other specified disorders of breast: Secondary | ICD-10-CM

## 2021-09-29 DIAGNOSIS — R928 Other abnormal and inconclusive findings on diagnostic imaging of breast: Secondary | ICD-10-CM

## 2021-10-12 ENCOUNTER — Ambulatory Visit
Admission: RE | Admit: 2021-10-12 | Discharge: 2021-10-12 | Disposition: A | Discharge status: Home / Self Care | Payer: BC Managed Care – PPO | Source: Ambulatory Visit | Attending: Family Medicine | Admitting: Family Medicine

## 2021-10-12 DIAGNOSIS — N6489 Other specified disorders of breast: Secondary | ICD-10-CM | POA: Insufficient documentation

## 2021-10-12 DIAGNOSIS — R928 Other abnormal and inconclusive findings on diagnostic imaging of breast: Secondary | ICD-10-CM | POA: Insufficient documentation

## 2021-10-12 HISTORY — PX: BREAST BIOPSY: SHX20

## 2021-10-13 LAB — SURGICAL PATHOLOGY

## 2022-10-12 ENCOUNTER — Other Ambulatory Visit: Payer: Self-pay | Admitting: Family Medicine

## 2022-10-12 DIAGNOSIS — Z1231 Encounter for screening mammogram for malignant neoplasm of breast: Secondary | ICD-10-CM

## 2022-10-31 ENCOUNTER — Ambulatory Visit
Admission: RE | Admit: 2022-10-31 | Discharge: 2022-10-31 | Disposition: A | Discharge status: Home / Self Care | Payer: BC Managed Care – PPO | Source: Ambulatory Visit | Attending: Family Medicine | Admitting: Family Medicine

## 2022-10-31 DIAGNOSIS — Z1231 Encounter for screening mammogram for malignant neoplasm of breast: Secondary | ICD-10-CM | POA: Insufficient documentation

## 2022-12-24 ENCOUNTER — Encounter: Payer: Self-pay | Admitting: Medical

## 2022-12-24 ENCOUNTER — Ambulatory Visit (INDEPENDENT_AMBULATORY_CARE_PROVIDER_SITE_OTHER): Payer: Self-pay | Admitting: Medical

## 2022-12-24 DIAGNOSIS — U071 COVID-19: Secondary | ICD-10-CM

## 2022-12-24 NOTE — Progress Notes (Signed)
[facility] [address] [place], [place] [address]   Office Visit Note  Patient Name: Deborah Collier Date of Birth [id number]  Medical Record number [id number]  Date of Service: 12/24/2022  Chief Complaint  Patient presents with   Covid Positive     HPI 52 YO female patient presents for telephone visit related to COVID-19 illness.  Tested + for COVID-19 (at-home rapid test) 4 days ago (12/20/22), still + yesterday (repeat rapid test). Sx began 4-5 days ago, has started to feel better.  Has cough, clogged ears, head congestion and some decreased taste. Subjective fever sx 3-4 days ago, has been resolved at least 24-36 hrs.  Feels slightly more out of breath/winded than usual. Works as Museum/gallery exhibitions officer at [employer], several staff have been sick recently (at least one with COVID-19). Patient has returned to work.  Has taken Mucinex product, which has been helpful.  Hx of hypothyroidism, prediabetes and migraines.  Has kids at home. Wondering when it would be helpful to retest herself and how long she may continue to test positive. Patient states at-home tests are no longer available at local [facility].   Current Medication:  Outpatient Encounter Medications as of 12/24/2022  Medication Sig   aspirin-acetaminophen-caffeine (EXCEDRIN MIGRAINE) 250-250-65 MG tablet Take 1 tablet by mouth every 6 (six) hours as needed for headache or migraine.   ibuprofen (ADVIL) 200 MG tablet Take 200 mg by mouth every 6 (six) hours as needed for headache, mild pain or moderate pain.   levothyroxine (SYNTHROID) 75 MCG tablet Take 75 mcg by mouth daily before breakfast.   metFORMIN (GLUCOPHAGE) 1000 MG tablet Take 1,500 mg by mouth every evening.   metFORMIN (GLUCOPHAGE) 500 MG tablet Take 1,500 mg by mouth daily. (Patient not taking: Reported on 04/25/2021)   Phendimetrazine Tartrate 105 MG CP24 Take by mouth.   rizatriptan (MAXALT-MLT) 5 MG disintegrating tablet Take 5 mg by mouth  daily.   No facility-administered encounter medications on file as of 12/24/2022.      Medical History: Past Medical History:  Diagnosis Date   Hx of migraine headaches    Polycystic disease, ovaries    Thyroid disease      Vital Signs: There were no vitals taken for this visit. Visit performed via telephone.   Review of Systems  Constitutional:  Positive for fatigue and fever.  HENT:  Positive for congestion.   Respiratory:  Positive for cough and shortness of breath (mild).     Physical Exam Exam not performed - Visit done via telephone.   Assessment/Plan: 1. COVID-19 Symptoms described by patient are mild-moderate and are improving. Patient denies fever in last 24-36 hours. Discussed that there are no longer published CDC guidelines regarding isolation for COVID-19. However, it is generally recommended to isolate from others as much as possible until symptoms are improving and fever has been resolved for at least 24 hours. Patient may be at work but should wear a mask when around others/in public places until symptoms resolve.   Would not recommend retesting with antigen test for at least 48 hours. A negative repeat test does not guarantee she will no longer be contagious to others.   Patient Instructions: -Rest and stay well hydrated (by drinking water and other liquids). Avoid/limit caffeine. -Take over-the-counter medicines (i.e.  Mucinex, Dayquil or Nyquil ) to help relieve your symptoms. -For a sore throat/cough, use cough drops/throat lozenges, gargle warm salt water and/or drink warm liquids (like tea with honey). -You may  schedule an appointment for a repeat COVID-19 antigen test or take another at-home test in 2-3 days. Remember that you can still be contagious to others even if you test negative. -Continue to limit your contact with others and wear a mask when in close contact or in public places until your symptoms resolve -Send MyChart message to provider or  schedule return visit as needed for new/worsening symptoms (i.e. shortness of breath, chest pain, persistent vomiting or diarrhea) or if symptoms do not improve as discussed with recommended treatment over next 5-7 days.    General Counseling: Torie verbalizes understanding of the findings of todays visit and agrees with plan of treatment. she has been encouraged to call the office with any questions or concerns that should arise related to todays visit.    Time spent:10 Minutes  Jonathon Resides PA-C Physician Assistant

## 2022-12-24 NOTE — Patient Instructions (Addendum)
-  Rest and stay well hydrated (by drinking water and other liquids). Avoid/limit caffeine. -Take over-the-counter medicines (i.e. Mucinex, Dayquil or Nyquil) to help relieve your symptoms. -For a sore throat/cough, use cough drops/throat lozenges, gargle warm salt water and/or drink warm liquids (like tea with honey). -You may schedule an appointment for a repeat COVID-19 antigen test or take another at-home test in 2-3 days. Remember that you can still be contagious to others even if you test negative. -Continue to limit your contact with others and wear a mask when in close contact or in public places until your symptoms resolve -Send MyChart message to provider or schedule return visit as needed for new/worsening symptoms (i.e. shortness of breath, chest pain, persistent vomiting or diarrhea) or if symptoms do not improve as discussed with recommended treatment over next 5-7 days.

## 2022-12-26 ENCOUNTER — Encounter: Payer: Self-pay | Admitting: Physician Assistant

## 2022-12-26 ENCOUNTER — Ambulatory Visit (INDEPENDENT_AMBULATORY_CARE_PROVIDER_SITE_OTHER): Payer: Self-pay | Admitting: Physician Assistant

## 2022-12-26 VITALS — BP 130/84 | HR 95 | Temp 97.3°F | Ht 63.0 in

## 2022-12-26 DIAGNOSIS — U071 COVID-19: Secondary | ICD-10-CM

## 2022-12-26 NOTE — Progress Notes (Signed)
[facility] [address] [place], [place] [address]   Office Visit Note  Patient Name: Deborah Collier Date of Birth [id number]  Medical Record number [id number]  Date of Service: 12/26/2022  Chief Complaint  Patient presents with   Acute Visit    Patient had an exposure to COVID at work last week. She states she then tested positive on 7/25. She went back to work two days ago but would like to know when she can stop wearing her mask. She is requesting to be retested. She feels well today.     52 y/o F presents to the clinic for testing positive for covid 6 days ago. She is feeling better now. Denies fever, CP, SOB. Occasional cough for which she takes Mucinex with Sudafed.       Current Medication:  Outpatient Encounter Medications as of 12/26/2022  Medication Sig   ALPRAZolam (XANAX) 0.25 MG tablet Take 0.25 mg by mouth daily as needed.   aspirin-acetaminophen-caffeine (EXCEDRIN MIGRAINE) 250-250-65 MG tablet Take 1 tablet by mouth every 6 (six) hours as needed for headache or migraine.   ibuprofen (ADVIL) 200 MG tablet Take 200 mg by mouth every 6 (six) hours as needed for headache, mild pain or moderate pain.   levothyroxine (SYNTHROID) 75 MCG tablet Take 75 mcg by mouth daily before breakfast.   metFORMIN (GLUCOPHAGE) 1000 MG tablet Take 1,500 mg by mouth every evening.   Phendimetrazine Tartrate 105 MG CP24 Take by mouth daily.   rizatriptan (MAXALT-MLT) 5 MG disintegrating tablet Take 5 mg by mouth daily.   [DISCONTINUED] metFORMIN (GLUCOPHAGE) 500 MG tablet Take 1,500 mg by mouth daily. (Patient not taking: Reported on 04/25/2021)   No facility-administered encounter medications on file as of 12/26/2022.      Medical History: Past Medical History:  Diagnosis Date   Hx of migraine headaches    Polycystic disease, ovaries    Thyroid disease      Vital Signs: BP 130/84 (BP Location: Left Arm, Patient Position: Sitting, Cuff Size: Normal)   Pulse 95   Temp  (!) 97.3 F (36.3 C)   Ht 5\' 3"  (1.6 m)   SpO2 98%   BMI 29.23 kg/m    Review of Systems  Constitutional: Negative.   HENT:  Positive for congestion. Negative for sinus pressure, sinus pain, sore throat and trouble swallowing.   Respiratory: Negative.    Cardiovascular: Negative.   Neurological: Negative.     Physical Exam Constitutional:      Appearance: Normal appearance.  HENT:     Head: Atraumatic.     Right Ear: Tympanic membrane, ear canal and external ear normal.     Left Ear: Tympanic membrane, ear canal and external ear normal.     Nose: Nose normal.     Mouth/Throat:     Mouth: Mucous membranes are moist.     Pharynx: Oropharynx is clear.  Eyes:     Extraocular Movements: Extraocular movements intact.  Cardiovascular:     Rate and Rhythm: Normal rate and regular rhythm.  Pulmonary:     Effort: Pulmonary effort is normal.     Breath sounds: Normal breath sounds.  Musculoskeletal:     Cervical back: Neck supple.  Skin:    General: Skin is warm.  Neurological:     Mental Status: She is alert.  Psychiatric:        Mood and Affect: Mood normal.        Behavior: Behavior normal.  Thought Content: Thought content normal.        Judgment: Judgment normal.       Assessment/Plan:  1. COVID-19 virus infection  Increase fluids. Re-testing for covid is not recommended. Reviewed CDC recommendation for Covid infection. She is past the 5 days of isolation. She is to wear a mask through the remainder of the week while at workplace or around others. She will contact us if she has questions or concerns. Pt verbalized understanding and in agreement.    General Counseling: Deborah Collier verbalizes understanding of the findings of todays visit and agrees with plan of treatment. I have discussed any further diagnostic evaluation that may be needed or ordered today. We also reviewed her medications today. she has been encouraged to call the office with any questions or  concerns that should arise related to todays visit.    Time spent:20 Minutes    Gilberto Better, [place] Physician Assistant

## 2022-12-30 IMAGING — MG MM BREAST BX W LOC DEV 1ST LESION IMAGE BX SPEC STEREO GUIDE*L*
8 of 9 series · 8 of 17 positions shown · non-contrast
Comparison: None Available.
COMPARISON: None Available.

Addendum:
CLINICAL DATA: 51-year-old female presenting for stereotactic
biopsy of a left breast asymmetry.

EXAM:
LEFT BREAST STEREOTACTIC CORE NEEDLE BIOPSY

[L (1 of 6)]
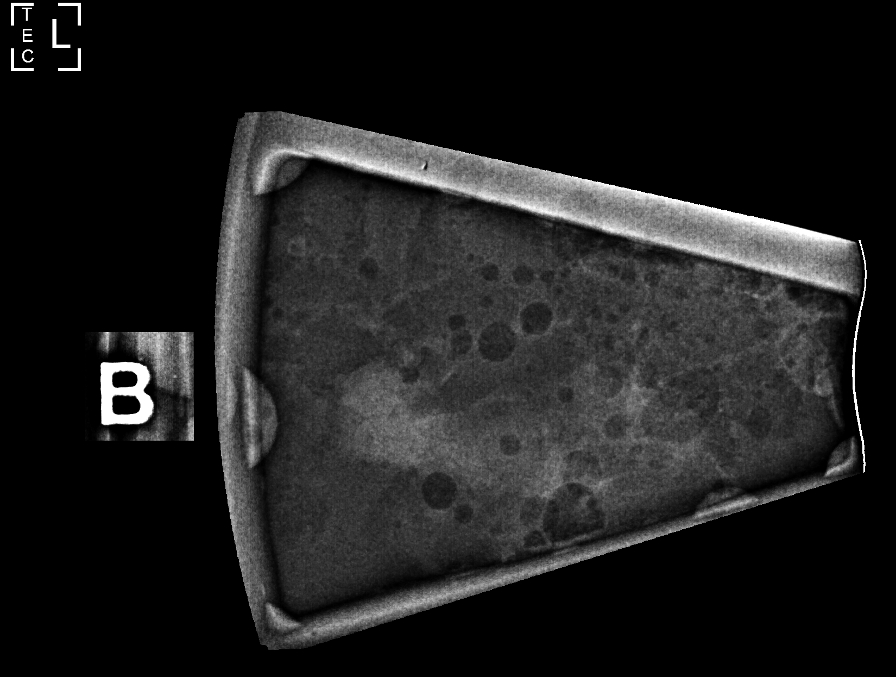

[L (2 of 6)]
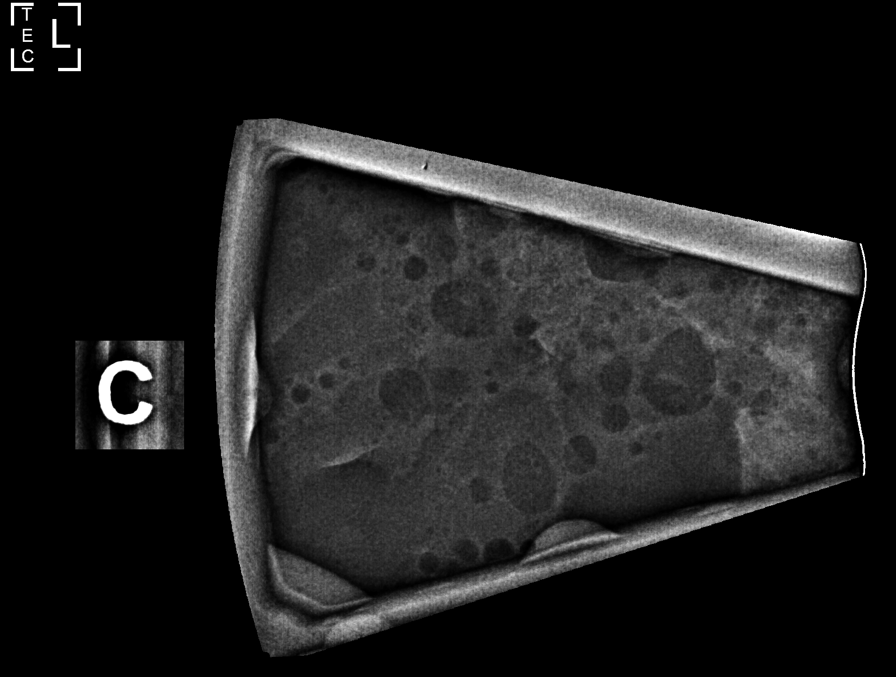

[L (3 of 6)]
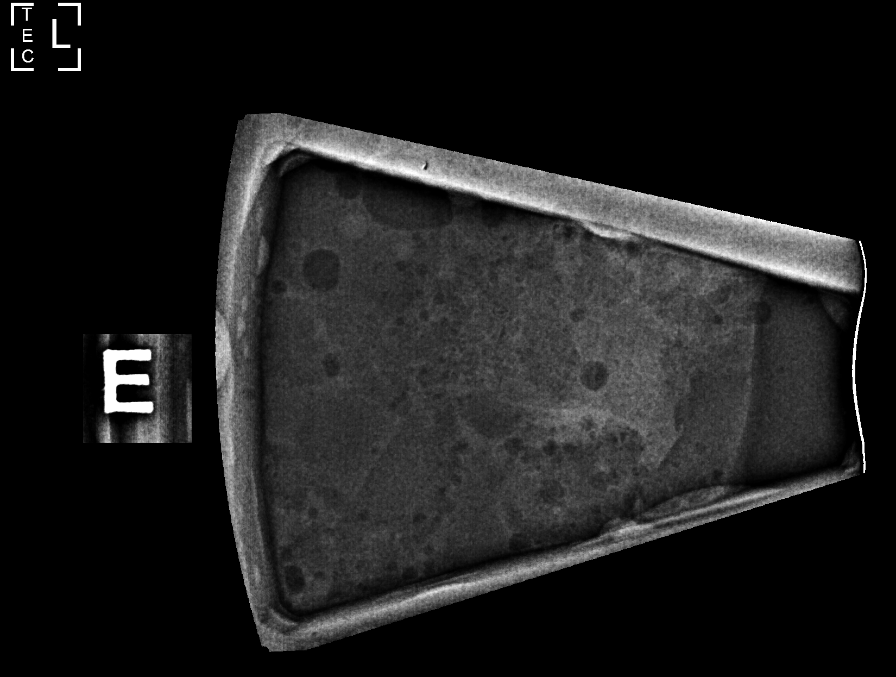

[L (4 of 6)]
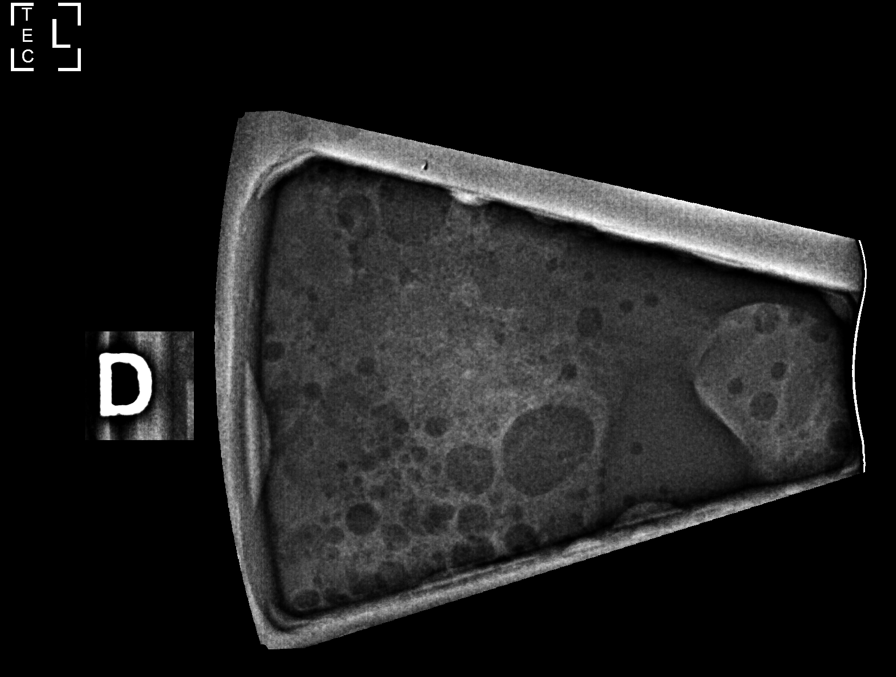

[L (5 of 6)]
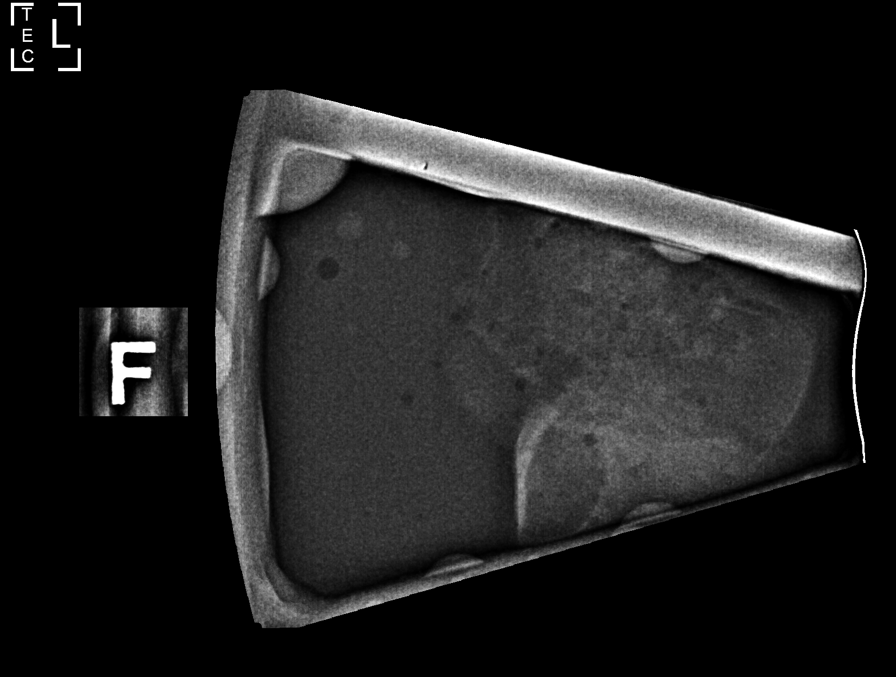

[L (6 of 6)]
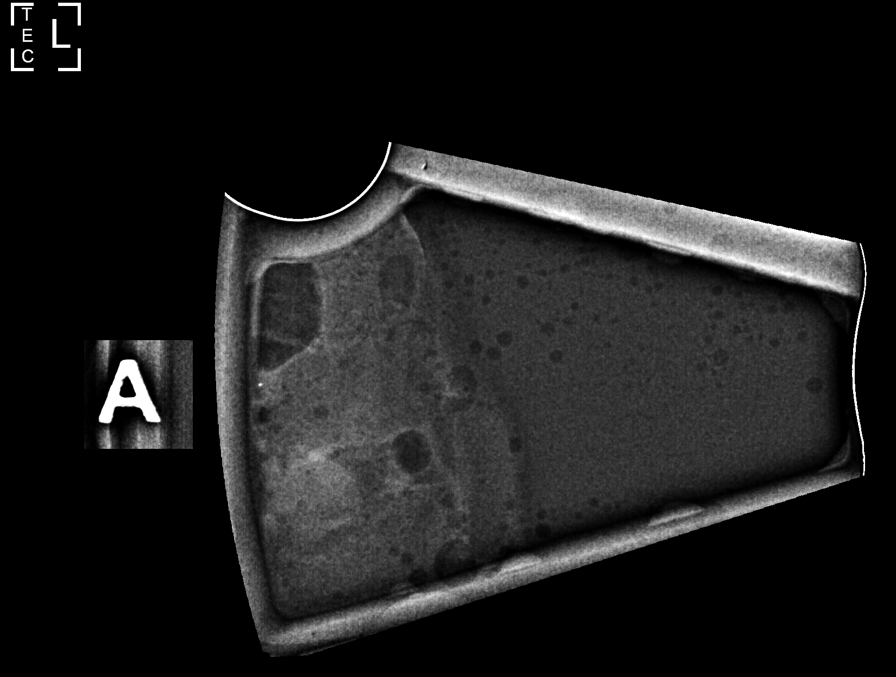

[L CC]
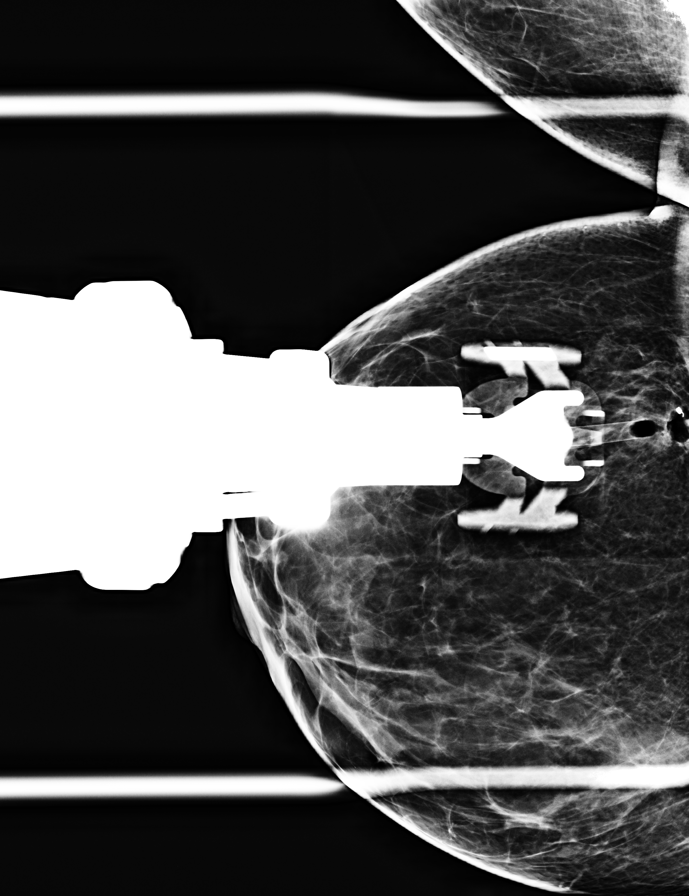

[L CC tomo · tomo slice 27/54.0]
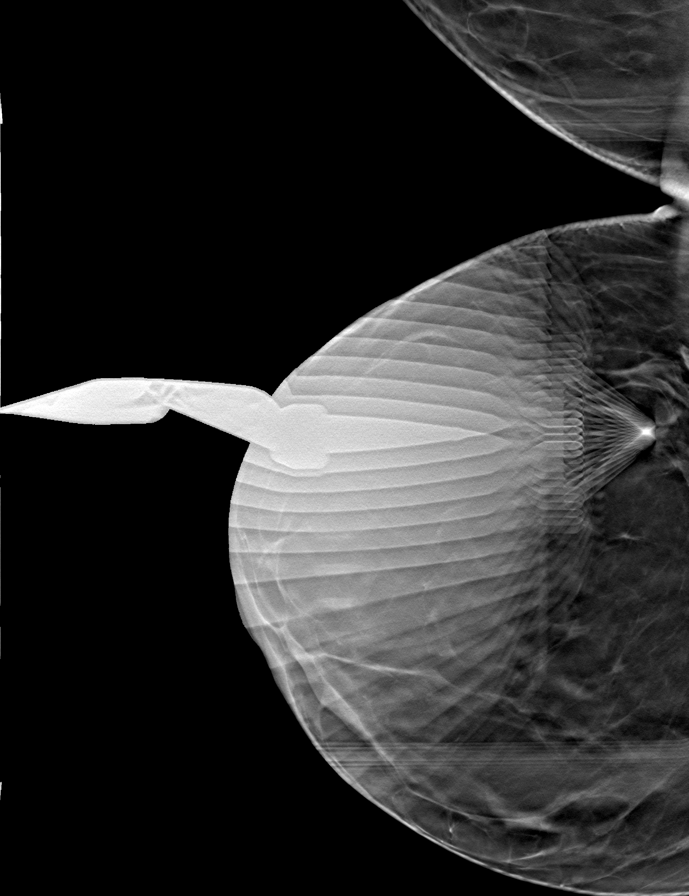

[8 of 17 positions shown; findings below may reference images not displayed]



Using sterile technique and 1% Lidocaine as local anesthetic, under
stereotactic guidance, a 9 gauge vacuum assisted device was used to
perform core needle biopsy of an asymmetry in the upper inner left
breast using a superior approach.

Lesion quadrant: Upper inner quadrant

At the conclusion of the procedure, a coil shaped tissue marker clip
was deployed into the biopsy cavity. Follow-up 2-view mammogram was
performed and dictated separately.
IMPRESSION: Stereotactic-guided biopsy of an asymmetry in the upper inner left
breast. No apparent complications.

ADDENDUM:
Pathology revealed PREDOMINANTLY MATURE ADIPOSE TISSUE WITH FOCAL
AREAS OF BENIGN FIBROUS STROMA AND MAMMARY GLANDS- NEGATIVE FOR
ATYPIA OR MALIGNANCY of the LEFT breast, upper inner (coil clip).
This was found to be concordant by Dr. Macho Tiger.

Pathology results were discussed with the patient by telephone. The
patient reported doing well after the biopsy with tenderness at the
site. Post biopsy instructions and care were reviewed and questions
were answered. The patient was encouraged to call [facility]HOSPITAL[facility] for any additional
concerns.

The patient was instructed to return for annual screening
mammography and informed a reminder notice would be sent regarding
this appointment.

Pathology results reported by Morrad Tamazight RN on 10/13/2021.



Using sterile technique and 1% Lidocaine as local anesthetic, under
stereotactic guidance, a 9 gauge vacuum assisted device was used to
perform core needle biopsy of an asymmetry in the upper inner left
breast using a superior approach.

Lesion quadrant: Upper inner quadrant

At the conclusion of the procedure, a coil shaped tissue marker clip
was deployed into the biopsy cavity. Follow-up 2-view mammogram was
performed and dictated separately.
IMPRESSION: Stereotactic-guided biopsy of an asymmetry in the upper inner left
breast. No apparent complications.

## 2023-03-16 ENCOUNTER — Ambulatory Visit
Admission: RE | Admit: 2023-03-16 | Discharge: 2023-03-16 | Disposition: A | Discharge status: Home / Self Care | Payer: BC Managed Care – PPO | Source: Ambulatory Visit | Attending: Physician Assistant | Admitting: Physician Assistant

## 2023-03-16 VITALS — BP 113/71 | HR 97 | Temp 98.1°F | Resp 18

## 2023-03-16 DIAGNOSIS — R490 Dysphonia: Secondary | ICD-10-CM | POA: Insufficient documentation

## 2023-03-16 DIAGNOSIS — J029 Acute pharyngitis, unspecified: Secondary | ICD-10-CM | POA: Insufficient documentation

## 2023-03-16 LAB — POCT RAPID STREP A (OFFICE): Rapid Strep A Screen: NEGATIVE

## 2023-03-16 MED ORDER — PREDNISONE 10 MG (21) PO TBPK: ORAL_TABLET | ORAL | 0 refills | Status: DC

## 2023-03-16 MED ORDER — LIDOCAINE VISCOUS HCL 2 % MT SOLN: 15.0000 mL | Freq: Four times a day (QID) | OROMUCOSAL | 0 refills | Status: DC | PRN

## 2023-03-16 NOTE — ED Provider Notes (Signed)
Renaldo Fiddler    CSN: [id number] Arrival date & time: 03/16/23  0944      History   Chief Complaint Chief Complaint  Patient presents with   Generalized Body Aches    Severe pain when swallowing along with pounding headache - Entered by patient    HPI Deborah Collier is a 52 y.o. female.   Patient presents today with a 2 to 3-day history of URI symptoms including mild cough, sore throat, hoarseness, headache, body aches.  Denies any congestion, fever, nausea, vomiting, diarrhea.  She has tried Advil as well as her previously prescribed daily antihistamine without improvement of symptoms.  Reports her son was sick with similar symptoms and treated with antibiotics and steroids.  She is unsure what he was ultimately diagnosed with.  She has had COVID within the past 3 months (end of July).  Denies any recent antibiotics or steroids.  She is able to swallow and manage her secretions.  She reports that sore throat pain is rated 8 on a 0-10 pain scale, described as sharp, worse with swallowing, no alleviating factors identified.    Past Medical History:  Diagnosis Date   Hx of migraine headaches    Polycystic disease, ovaries    Thyroid disease     There are no problems to display for this patient.   Past Surgical History:  Procedure Laterality Date   ABDOMINAL HYSTERECTOMY     BREAST BIOPSY Left 10/12/2021   affirm bx, coil marker, benign   CESAREAN SECTION     TONSILLECTOMY      OB History   No obstetric history on file.      Home Medications    Prior to Admission medications   Medication Sig Start Date End Date Taking? Authorizing Provider  lidocaine (XYLOCAINE) 2 % solution Use as directed 15 mLs in the mouth or throat every 6 (six) hours as needed for mouth pain. 03/16/23  Yes Kimbley Sprague K, PA-C  predniSONE (STERAPRED UNI-PAK 21 TAB) 10 MG (21) TBPK tablet As directed 03/16/23  Yes Asahd Can K, PA-C  ALPRAZolam (XANAX) 0.25 MG tablet Take 0.25 mg by  mouth daily as needed. 07/27/22   [provider]  aspirin-acetaminophen-caffeine (EXCEDRIN MIGRAINE) [phone] MG tablet Take 1 tablet by mouth every 6 (six) hours as needed for headache or migraine.    [provider]  ibuprofen (ADVIL) 200 MG tablet Take 200 mg by mouth every 6 (six) hours as needed for headache, mild pain or moderate pain.    [provider]  levothyroxine (SYNTHROID) 75 MCG tablet Take 75 mcg by mouth daily before breakfast.    [provider]  metFORMIN (GLUCOPHAGE) 1000 MG tablet Take 1,500 mg by mouth every evening.    [provider]  Phendimetrazine Tartrate 105 MG CP24 Take by mouth daily. 11/07/20   [provider]  rizatriptan (MAXALT-MLT) 5 MG disintegrating tablet Take 5 mg by mouth daily. 12/09/19   [provider]    Family History Family History  Problem Relation Age of Onset   Breast cancer Neg Hx     Social History Social History   Tobacco Use   Smoking status: Former    Types: Cigarettes   Smokeless tobacco: Never  Vaping Use   Vaping status: Every Day  Substance Use Topics   Alcohol use: Never   Drug use: Never     Allergies   Patient has no known allergies.   Review of Systems Review of Systems  Constitutional:  Positive for activity change. Negative for appetite change, fatigue and fever.  HENT:  Positive for sore throat and trouble swallowing. Negative for congestion, sinus pressure, sneezing and voice change.   Respiratory:  Positive for cough. Negative for shortness of breath.   Cardiovascular:  Negative for chest pain.  Gastrointestinal:  Negative for abdominal pain, diarrhea, nausea and vomiting.  Musculoskeletal:  Positive for arthralgias and myalgias.  Neurological:  Positive for headaches.     Physical Exam Triage Vital Signs ED Triage Vitals  Encounter Vitals Group     BP 03/16/23 0954 113/71     Systolic BP Percentile --      Diastolic BP Percentile --       Pulse Rate 03/16/23 0954 97     Resp 03/16/23 0954 18     Temp 03/16/23 0954 98.1 F (36.7 C)     Temp src --      SpO2 03/16/23 0954 97 %     Weight --      Height --      Head Circumference --      Peak Flow --      Pain Score 03/16/23 0955 8     Pain Loc --      Pain Education --      Exclude from Growth Chart --    No data found.  Updated Vital Signs BP 113/71   Pulse 97   Temp 98.1 F (36.7 C)   Resp 18   SpO2 97%   Visual Acuity Right Eye Distance:   Left Eye Distance:   Bilateral Distance:    Right Eye Near:   Left Eye Near:    Bilateral Near:     Physical Exam Vitals reviewed.  Constitutional:      General: She is awake. She is not in acute distress.    Appearance: Normal appearance. She is well-developed. She is not ill-appearing.     Comments: Very pleasant female appears stated age in no acute distress sitting comfortably in exam room  HENT:     Head: Normocephalic and atraumatic.     Right Ear: Tympanic membrane, ear canal and external ear normal. Tympanic membrane is not erythematous or bulging.     Left Ear: Tympanic membrane, ear canal and external ear normal. Tympanic membrane is not erythematous or bulging.     Mouth/Throat:     Pharynx: Uvula midline. Posterior oropharyngeal erythema and postnasal drip present. No oropharyngeal exudate.  Cardiovascular:     Rate and Rhythm: Normal rate and regular rhythm.     Heart sounds: Normal heart sounds, S1 normal and S2 normal. No murmur heard. Pulmonary:     Effort: Pulmonary effort is normal.     Breath sounds: Normal breath sounds. No wheezing, rhonchi or rales.     Comments: Clear to auscultation bilaterally Lymphadenopathy:     Head:     Right side of head: No submental, submandibular or tonsillar adenopathy.     Left side of head: No submental, submandibular or tonsillar adenopathy.     Cervical: No cervical adenopathy.  Psychiatric:        Behavior: Behavior is cooperative.      UC  Treatments / Results  Labs (all labs ordered are listed, but only abnormal results are displayed) Labs Reviewed  CULTURE, GROUP A STREP [facility])  POCT RAPID STREP A (OFFICE)    EKG   Radiology No results found.  Procedures Procedures (including critical care time)  Medications Ordered  in UC Medications - No data to display  Initial Impression / Assessment and Plan / UC Course  I have reviewed the triage vital signs and the nursing notes.  Pertinent labs & imaging results that were available during my care of the patient were reviewed by me and considered in my medical decision making (see chart for details).     Patient is well-appearing, afebrile, nontoxic, nontachycardic.  Strep testing was obtained and was negative.  Will send this for culture but defer antibiotics until culture results are available.  COVID testing was deferred as patient has had COVID within the past 90 days.  Suspect viral etiology.  Will start prednisone to help manage symptoms.  Discussed that she is not to take NSAIDs with this medication due to risk of GI bleeding.  She can use acetaminophen and gargle with warm salt water for additional symptom relief.  Viscous lidocaine was sent to pharmacy with instruction to use this every 4-6 hours as needed.  We discussed that she should not eat or drink immediately after using this medication due to risk of choking.  If her symptoms are not improving within a week she is to return for reevaluation.  Discussed that if anything worsens and she has swelling of her throat, shortness of breath, muffled voice, dysphagia, nausea, vomiting, high fever she needs to be seen immediately.  Strict return precautions given.  Work excuse note provided.  Final Clinical Impressions(s) / UC Diagnoses   Final diagnoses:  Viral pharyngitis  Hoarseness of voice  Sore throat     Discharge Instructions      Your strep test was negative.  We are sending this for culture and we will  contact you if we need to start antibiotics based on this result.  Start prednisone as prescribed.  Do not take NSAIDs with this medication including aspirin, ibuprofen/Advil, naproxen/Aleve.  You can use Tylenol/acetaminophen as needed.  Use lidocaine to help with your sore throat.  Do not eat or drink immediately after using this medication as it can increase the risk of choking.  Wait 15 to 20 minutes after using it.  If your symptoms are not improving within a week please return for reevaluation.  If anything worsens and you have difficulty swallowing, swelling of your throat, shortness of breath, nausea, vomiting, fever you need to be seen immediately.     ED Prescriptions     Medication Sig Dispense Auth. Provider   predniSONE (STERAPRED UNI-PAK 21 TAB) 10 MG (21) TBPK tablet As directed 21 tablet Corin Formisano K, PA-C   lidocaine (XYLOCAINE) 2 % solution Use as directed 15 mLs in the mouth or throat every 6 (six) hours as needed for mouth pain. 100 mL Dorri Ozturk K, PA-C      PDMP not reviewed this encounter.   Jeani Hawking, PA-C 03/16/23 1028

## 2023-03-16 NOTE — ED Triage Notes (Signed)
Patient to Urgent Care with complaints of sore throat/ hoarseness, generalized body aches, and headaches. Denies any known fevers.   Reports symptoms started three days ago. Son sick with same symptoms.   Has been taking advil.

## 2023-03-16 NOTE — Discharge Instructions (Signed)
Your strep test was negative.  We are sending this for culture and we will contact you if we need to start antibiotics based on this result.  Start prednisone as prescribed.  Do not take NSAIDs with this medication including aspirin, ibuprofen/Advil, naproxen/Aleve.  You can use Tylenol/acetaminophen as needed.  Use lidocaine to help with your sore throat.  Do not eat or drink immediately after using this medication as it can increase the risk of choking.  Wait 15 to 20 minutes after using it.  If your symptoms are not improving within a week please return for reevaluation.  If anything worsens and you have difficulty swallowing, swelling of your throat, shortness of breath, nausea, vomiting, fever you need to be seen immediately.

## 2023-03-19 LAB — CULTURE, GROUP A STREP (THRC)

## 2023-06-19 ENCOUNTER — Encounter: Payer: Self-pay | Admitting: Adult Health

## 2023-06-19 ENCOUNTER — Ambulatory Visit (INDEPENDENT_AMBULATORY_CARE_PROVIDER_SITE_OTHER): Payer: Self-pay | Admitting: Adult Health

## 2023-06-19 ENCOUNTER — Other Ambulatory Visit: Payer: Self-pay

## 2023-06-19 VITALS — BP 120/82 | HR 104 | Temp 97.1°F | Ht 63.0 in

## 2023-06-19 DIAGNOSIS — M5432 Sciatica, left side: Secondary | ICD-10-CM

## 2023-06-19 MED ORDER — CYCLOBENZAPRINE HCL 10 MG PO TABS: 10.0000 mg | ORAL_TABLET | Freq: Every evening | ORAL | 0 refills | Status: AC | PRN

## 2023-06-19 MED ORDER — MELOXICAM 15 MG PO TABS: 15.0000 mg | ORAL_TABLET | Freq: Every day | ORAL | 0 refills | Status: DC

## 2023-06-19 NOTE — Progress Notes (Signed)
[facility] [address] [place], [place] [address]   Office Visit Note  Patient Name: Deborah Collier Date of Birth [id number]  Medical Record number [id number]  Date of Service: 06/19/2023  Chief Complaint  Patient presents with   Hip Pain    Patient c/o L hip pain x 1 day. She has been applying heat and ice and taking Advil. She was cleaning at work yesterday but did not have any known injury,     Hip Pain    Pt is here for a sick visit. She reports yesterday she started to notice left hip/buttock pain.  The pain is running down her leg. She has been stretching, took a hot bath, and used heating pad/ massage gun.  She took two advil, and then took 4 more.    Current Medication:  Outpatient Encounter Medications as of 06/19/2023  Medication Sig   ALPRAZolam (XANAX) 0.25 MG tablet Take 0.25 mg by mouth daily as needed.   aspirin-acetaminophen-caffeine (EXCEDRIN MIGRAINE) 250-250-65 MG tablet Take 1 tablet by mouth every 6 (six) hours as needed for headache or migraine.   cyclobenzaprine (FLEXERIL) 10 MG tablet Take 1 tablet (10 mg total) by mouth at bedtime as needed for muscle spasms.   ibuprofen (ADVIL) 200 MG tablet Take 200 mg by mouth every 6 (six) hours as needed for headache, mild pain or moderate pain.   levothyroxine (SYNTHROID) 75 MCG tablet Take 75 mcg by mouth daily before breakfast.   meloxicam (MOBIC) 15 MG tablet Take 1 tablet (15 mg total) by mouth daily.   metFORMIN (GLUCOPHAGE) 1000 MG tablet Take 1,500 mg by mouth every evening.   Phendimetrazine Tartrate 105 MG CP24 Take by mouth daily.   rizatriptan (MAXALT-MLT) 5 MG disintegrating tablet Take 5 mg by mouth as needed.   [DISCONTINUED] lidocaine (XYLOCAINE) 2 % solution Use as directed 15 mLs in the mouth or throat every 6 (six) hours as needed for mouth pain.   [DISCONTINUED] predniSONE (STERAPRED UNI-PAK 21 TAB) 10 MG (21) TBPK tablet As directed   No facility-administered encounter medications on  file as of 06/19/2023.      Medical History: Past Medical History:  Diagnosis Date   Hx of migraine headaches    Polycystic disease, ovaries    Thyroid disease      Vital Signs: BP 120/82   Pulse (!) 104   Temp (!) 97.1 F (36.2 C)   Ht 5\' 3"  (1.6 m)   SpO2 99%   BMI 29.23 kg/m    Review of Systems  Constitutional:  Negative for chills, fatigue and fever.  Musculoskeletal:        Left hip/buttock pain    Physical Exam Vitals reviewed.  Constitutional:      Appearance: Normal appearance.  Neurological:     Mental Status: She is alert.     Assessment/Plan: 1. Sciatic pain, left (Primary) Discussed Prednisone taper, pt declined at this time. Take Meloxicam, and flexeril as discussed.  - meloxicam (MOBIC) 15 MG tablet; Take 1 tablet (15 mg total) by mouth daily.  Dispense: 30 tablet; Refill: 0 - cyclobenzaprine (FLEXERIL) 10 MG tablet; Take 1 tablet (10 mg total) by mouth at bedtime as needed for muscle spasms.  Dispense: 15 tablet; Refill: 0     General Counseling: Cali verbalizes understanding of the findings of todays visit and agrees with plan of treatment. I have discussed any further diagnostic evaluation that may be needed or ordered today. We also reviewed her medications today.  she has been encouraged to call the office with any questions or concerns that should arise related to todays visit.   No orders of the defined types were placed in this encounter.   Meds ordered this encounter  Medications   meloxicam (MOBIC) 15 MG tablet    Sig: Take 1 tablet (15 mg total) by mouth daily.    Dispense:  30 tablet    Refill:  0   cyclobenzaprine (FLEXERIL) 10 MG tablet    Sig: Take 1 tablet (10 mg total) by mouth at bedtime as needed for muscle spasms.    Dispense:  15 tablet    Refill:  0    Time spent:15 Minutes    Johnna Acosta AGNP-C Nurse Practitioner

## 2023-07-16 ENCOUNTER — Other Ambulatory Visit: Payer: Self-pay | Admitting: Adult Health

## 2023-07-16 DIAGNOSIS — M5432 Sciatica, left side: Secondary | ICD-10-CM

## 2023-11-08 ENCOUNTER — Other Ambulatory Visit: Payer: Self-pay | Admitting: Family Medicine

## 2023-11-08 DIAGNOSIS — Z1231 Encounter for screening mammogram for malignant neoplasm of breast: Secondary | ICD-10-CM

## 2023-11-19 ENCOUNTER — Ambulatory Visit
Admission: RE | Admit: 2023-11-19 | Discharge: 2023-11-19 | Disposition: A | Discharge status: Home / Self Care | Source: Ambulatory Visit | Attending: Family Medicine | Admitting: Family Medicine

## 2023-11-19 DIAGNOSIS — Z1231 Encounter for screening mammogram for malignant neoplasm of breast: Secondary | ICD-10-CM | POA: Insufficient documentation

## 2024-03-26 ENCOUNTER — Ambulatory Visit (INDEPENDENT_AMBULATORY_CARE_PROVIDER_SITE_OTHER): Payer: Self-pay | Admitting: Adult Health

## 2024-03-26 ENCOUNTER — Encounter: Payer: Self-pay | Admitting: Adult Health

## 2024-03-26 DIAGNOSIS — S6710XA Crushing injury of unspecified finger(s), initial encounter: Secondary | ICD-10-CM

## 2024-03-26 NOTE — Progress Notes (Signed)
 [facility] [address]  [place], [place] [address] Phone [phone] Fax  [phone]  Worker's Compensation Report Form   Tomasa Dobransky Date of Birth:[birth year] Phone Number: [phone] Email:Unknown Department: Environmental Services Job Title: [employer] Supervisor:Raymond Product Manager Notified:no  Date of Injury:03/26/2024 Time of Injury:10am Shift Worked:First Shift Location where injury occurred (address or landmark):[employer]   Body Part Injured:Right Hand, Middle finger  Vital Signs There were no vitals taken for this visit. Pt unable to have vital taken due to pain. Injury Description  A machine came down onto her fingers of right hand.  Middle finger was stuck and crushed.    Provider Note Pt visibly in pain, rocking in chair, unable to let anyone tough those fingers.  Good Pulses in right wrist.    Diagnosis  1. Crushed finger, initial encounter (Primary) Abrasions noted on second and 4th fingers. Swelling to fingers 2-4, No obviously displacement on my limited exam.  Needs Emergent Imaging and possibly surgical intervention.      Medications Prescribed  No orders of the defined types were placed in this encounter.   Referred to [facility]    Return to Work Status Unknown at this time.    Provider Signature ________________________________________Date_________   Employee Signature _______________________________________Date_________   Please email this completed form to Berwyn Coach, Director of Risk Management at [email or web address] within 24 hours of visit.

## 5815-02-26 DEATH — deceased
